# Patient Record
Sex: Male | Born: 1965 | Race: White | Hispanic: No | Marital: Married
Health system: Southern US, Community
[De-identification: ages and names within clinical notes are randomized; demographics above are authoritative.]

---

## 2009-01-20 ENCOUNTER — Emergency Department (HOSPITAL_COMMUNITY): Admission: EM | Admit: 2009-01-20 | Discharge: 2009-01-20 | Payer: Self-pay | Admitting: Emergency Medicine

## 2010-07-19 LAB — CBC
HCT: 43.8 % (ref 39.0–52.0)
Hemoglobin: 14.7 g/dL (ref 13.0–17.0)
MCV: 86.6 fL (ref 78.0–100.0)
RBC: 5.06 MIL/uL (ref 4.22–5.81)
RDW: 12.6 % (ref 11.5–15.5)

## 2010-07-19 LAB — DIFFERENTIAL
Basophils Relative: 1 % (ref 0–1)
Monocytes Relative: 9 % (ref 3–12)

## 2010-07-19 LAB — POCT CARDIAC MARKERS
CKMB, poc: <1 ng/mL — ABNORMAL LOW (ref 1.0–8.0)
Myoglobin, poc: 50.8 ng/mL (ref 12–200)

## 2010-07-19 LAB — COMPREHENSIVE METABOLIC PANEL
AST: 17 U/L (ref 0–37)
Calcium: 10 mg/dL (ref 8.4–10.5)
GFR calc Af Amer: >60 mL/min (ref >60)
GFR calc non Af Amer: >60 mL/min (ref >60)
Sodium: 140 mEq/L (ref 135–145)

## 2014-03-30 ENCOUNTER — Ambulatory Visit
Admission: RE | Admit: 2014-03-30 | Discharge: 2014-03-30 | Disposition: A | Discharge status: Home / Self Care | Payer: BC Managed Care – PPO | Source: Ambulatory Visit | Attending: Family Medicine | Admitting: Family Medicine

## 2014-03-30 ENCOUNTER — Other Ambulatory Visit: Payer: Self-pay | Admitting: Family Medicine

## 2014-03-30 DIAGNOSIS — M79605 Pain in left leg: Secondary | ICD-10-CM

## 2014-03-30 DIAGNOSIS — R200 Anesthesia of skin: Secondary | ICD-10-CM

## 2015-08-25 IMAGING — CR DG LUMBAR SPINE COMPLETE 4+V
5 series · 5 of 5 positions shown · non-contrast
Comparison: None.

CLINICAL DATA: Lower back pain for 1 months. Sacral area,
especially when sitting. No known injury

EXAM:
LUMBAR SPINE - COMPLETE 4+ VIEW

[view not recorded (1 of 5)]
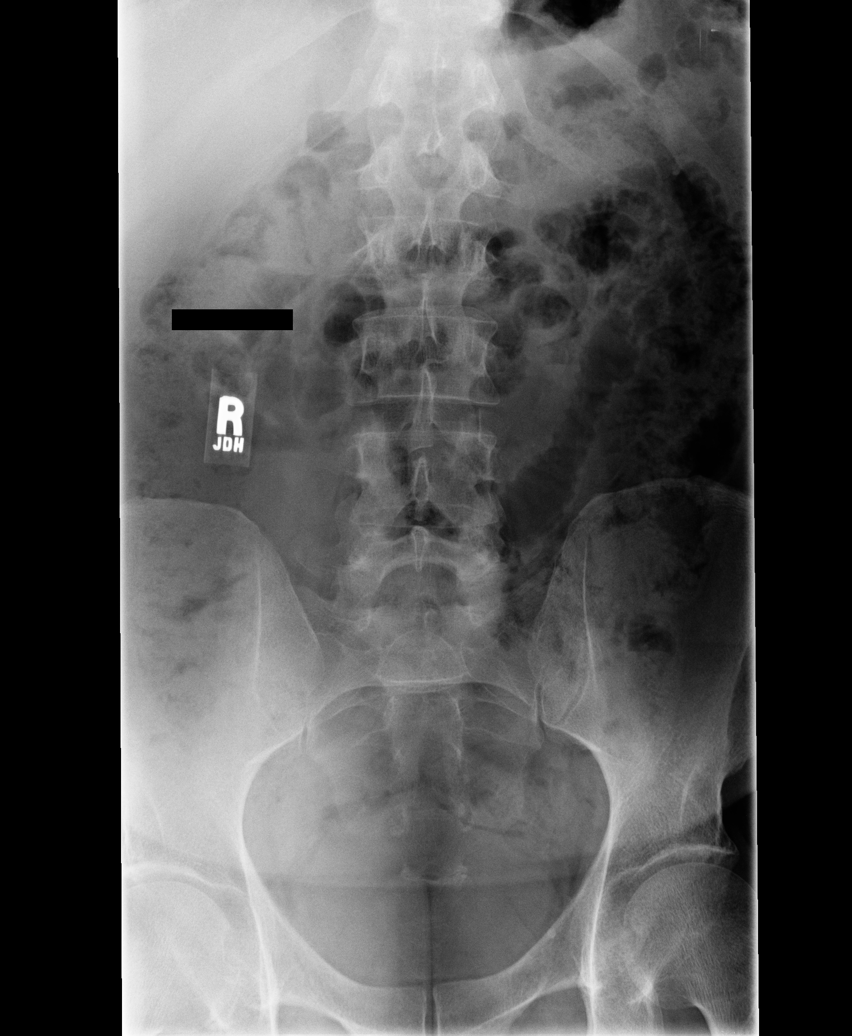

[view not recorded (2 of 5)]
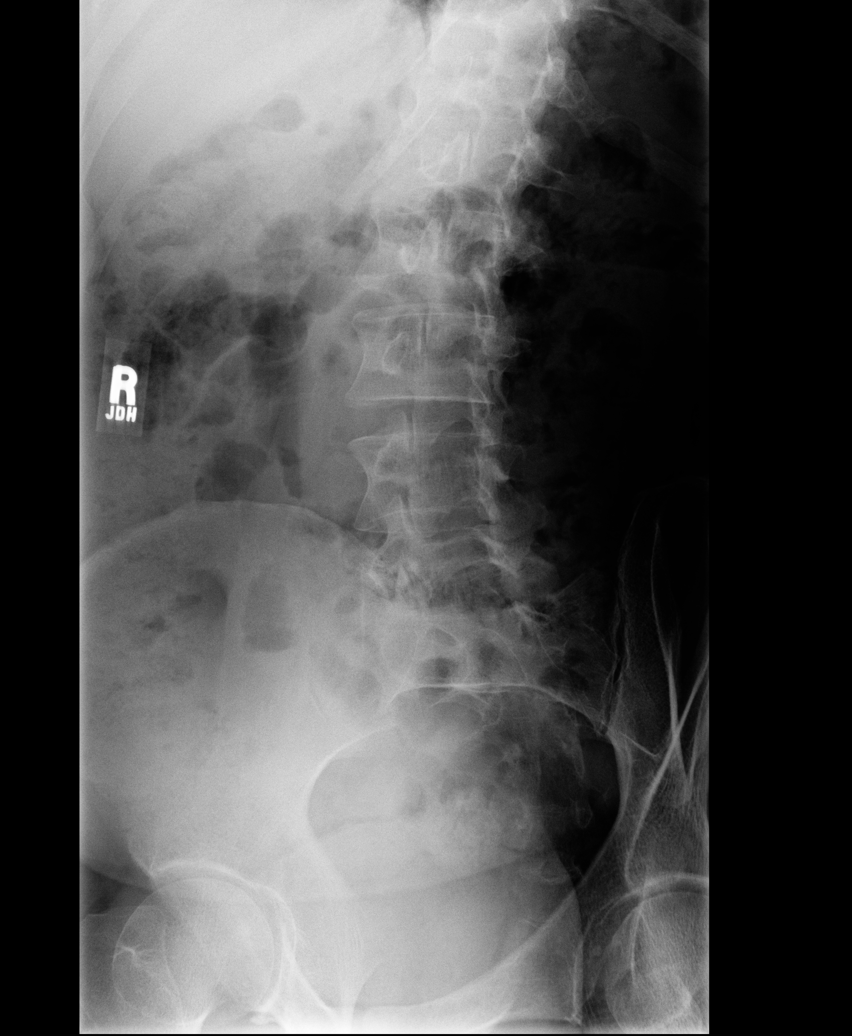

[view not recorded (3 of 5)]
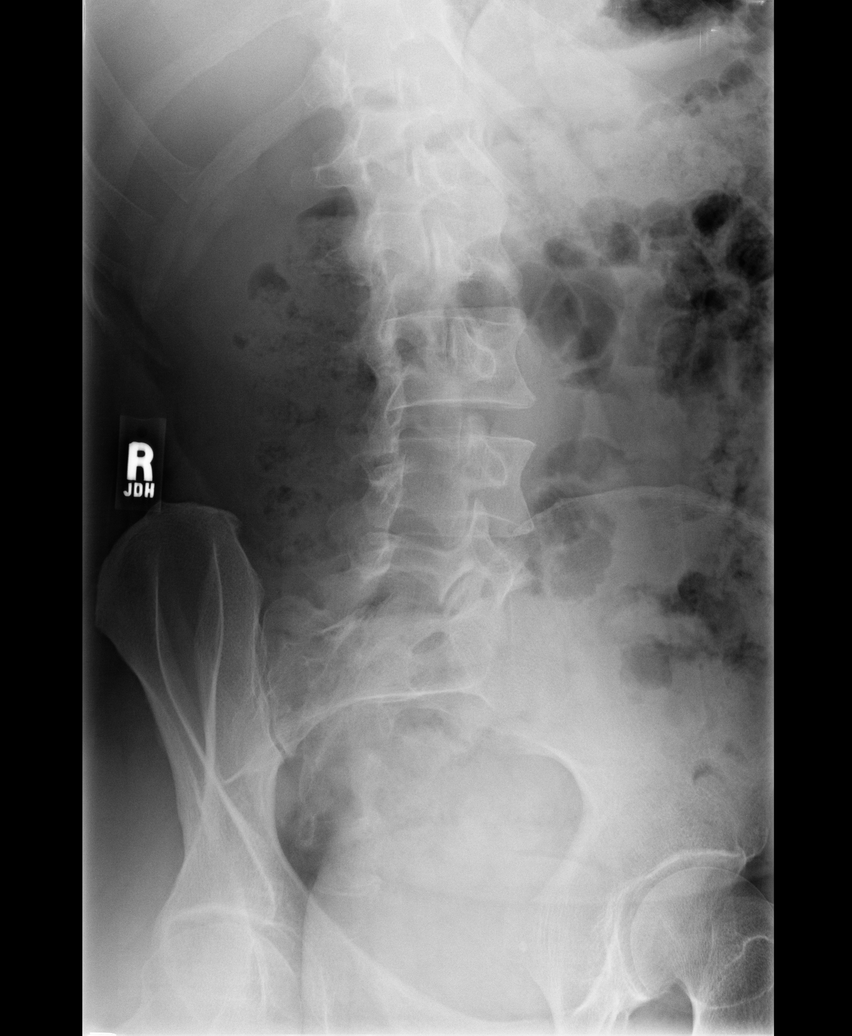

[view not recorded (4 of 5)]
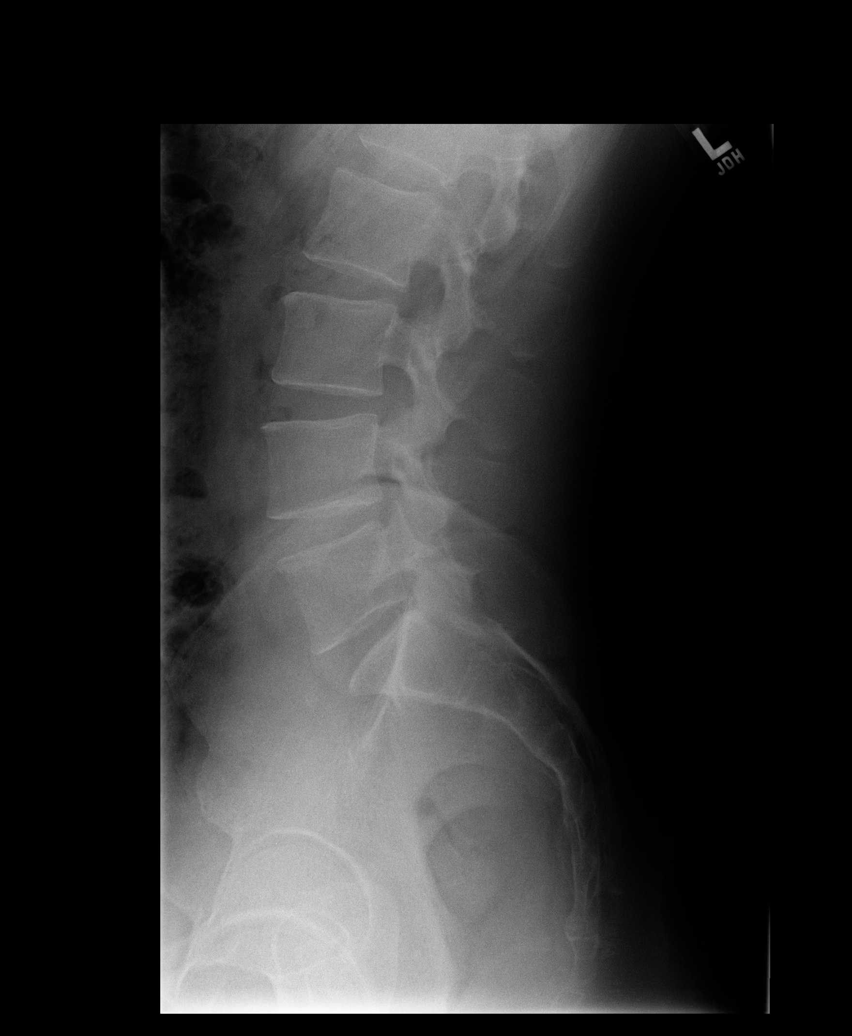

[view not recorded (5 of 5)]
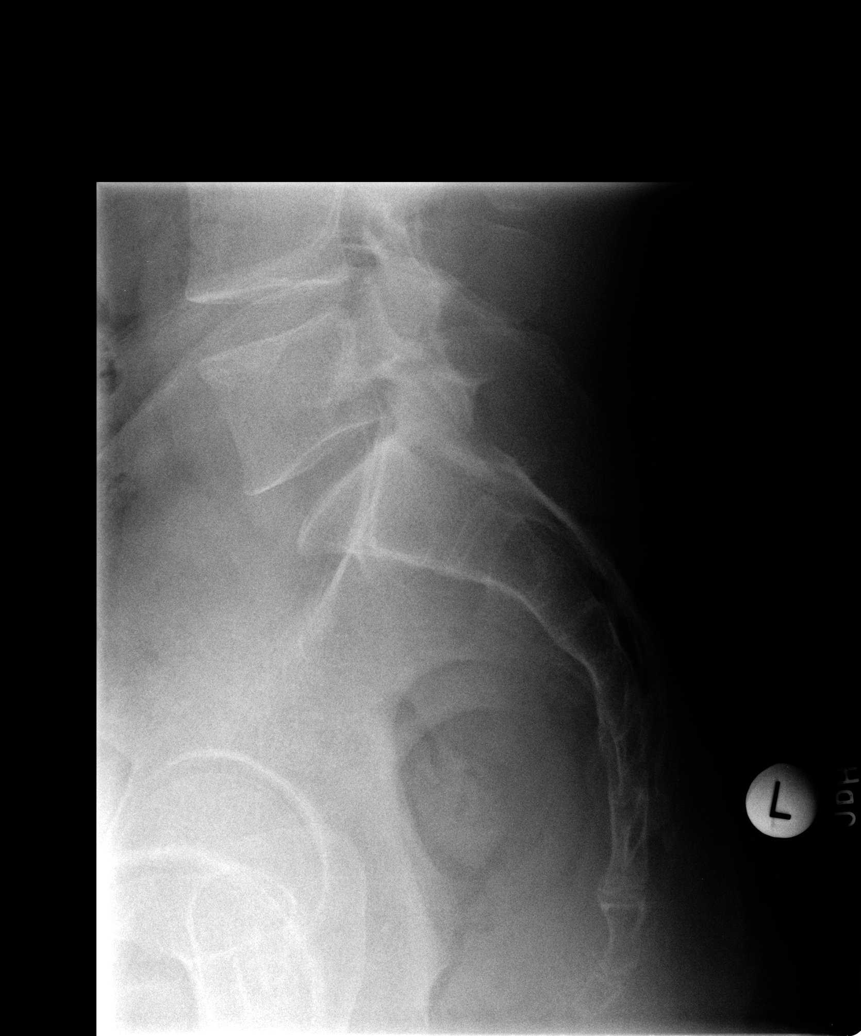

[5 of 5 positions shown; findings below may reference images not displayed]

FINDINGS: There is no evidence of lumbar spine fracture. Alignment is normal.
Intervertebral disc spaces are maintained.
IMPRESSION: Negative.

## 2015-10-06 DIAGNOSIS — S61032A Puncture wound without foreign body of left thumb without damage to nail, initial encounter: Secondary | ICD-10-CM | POA: Diagnosis not present

## 2016-08-19 DIAGNOSIS — Z0001 Encounter for general adult medical examination with abnormal findings: Secondary | ICD-10-CM | POA: Diagnosis not present

## 2016-08-21 DIAGNOSIS — E785 Hyperlipidemia, unspecified: Secondary | ICD-10-CM | POA: Diagnosis not present

## 2016-08-21 DIAGNOSIS — Z0001 Encounter for general adult medical examination with abnormal findings: Secondary | ICD-10-CM | POA: Diagnosis not present

## 2016-08-21 DIAGNOSIS — Z125 Encounter for screening for malignant neoplasm of prostate: Secondary | ICD-10-CM | POA: Diagnosis not present

## 2016-08-21 DIAGNOSIS — R102 Pelvic and perineal pain: Secondary | ICD-10-CM | POA: Diagnosis not present

## 2017-05-22 DIAGNOSIS — L03116 Cellulitis of left lower limb: Secondary | ICD-10-CM | POA: Diagnosis not present

## 2017-07-07 DIAGNOSIS — R05 Cough: Secondary | ICD-10-CM | POA: Diagnosis not present

## 2017-08-21 DIAGNOSIS — Z0000 Encounter for general adult medical examination without abnormal findings: Secondary | ICD-10-CM | POA: Diagnosis not present

## 2017-08-28 DIAGNOSIS — Z125 Encounter for screening for malignant neoplasm of prostate: Secondary | ICD-10-CM | POA: Diagnosis not present

## 2017-08-28 DIAGNOSIS — E785 Hyperlipidemia, unspecified: Secondary | ICD-10-CM | POA: Diagnosis not present

## 2018-01-28 DIAGNOSIS — R109 Unspecified abdominal pain: Secondary | ICD-10-CM | POA: Diagnosis not present

## 2018-03-31 DIAGNOSIS — R1031 Right lower quadrant pain: Secondary | ICD-10-CM | POA: Diagnosis not present

## 2018-04-07 ENCOUNTER — Other Ambulatory Visit: Payer: Self-pay | Admitting: Family Medicine

## 2018-08-06 ENCOUNTER — Ambulatory Visit
Admission: RE | Admit: 2018-08-06 | Discharge: 2018-08-06 | Disposition: A | Discharge status: Home / Self Care | Payer: BLUE CROSS/BLUE SHIELD | Source: Ambulatory Visit | Attending: Family Medicine | Admitting: Family Medicine

## 2018-08-06 ENCOUNTER — Other Ambulatory Visit: Payer: Self-pay | Admitting: Family Medicine

## 2018-08-06 ENCOUNTER — Other Ambulatory Visit: Payer: Self-pay

## 2018-08-06 DIAGNOSIS — M25551 Pain in right hip: Secondary | ICD-10-CM

## 2018-08-06 DIAGNOSIS — R1031 Right lower quadrant pain: Secondary | ICD-10-CM | POA: Diagnosis not present

## 2018-08-10 ENCOUNTER — Other Ambulatory Visit: Payer: Self-pay | Admitting: Family Medicine

## 2018-08-10 DIAGNOSIS — R1031 Right lower quadrant pain: Secondary | ICD-10-CM

## 2018-08-11 ENCOUNTER — Ambulatory Visit
Admission: RE | Admit: 2018-08-11 | Discharge: 2018-08-11 | Disposition: A | Discharge status: Home / Self Care | Payer: BLUE CROSS/BLUE SHIELD | Source: Ambulatory Visit | Attending: Family Medicine | Admitting: Family Medicine

## 2018-08-11 ENCOUNTER — Other Ambulatory Visit: Payer: Self-pay

## 2018-08-11 DIAGNOSIS — R1031 Right lower quadrant pain: Secondary | ICD-10-CM | POA: Diagnosis not present

## 2019-08-28 ENCOUNTER — Ambulatory Visit: Payer: Self-pay

## 2019-08-28 NOTE — Progress Notes (Signed)
   [facility]ov[facility] Vaccination [facility]  Name:  [name]    MRN: [id number] DOB: 10/29/1965  08/28/2019  [name] was observed post Covid-19 immunization for 15 minutes without incident. He was provided with Vaccine Information Sheet and instruction to access the V-Safe system.   [name] was instructed to call 911 with any severe reactions post vaccine: Marland Kitchen Difficulty breathing  . Swelling of face and throat  . A fast heartbeat  . A bad rash all over body  . Dizziness and weakness   Immunizations Administered    Name Date Dose VIS Date Route   Pfizer COVID-19 Vaccine 08/28/2019 11:32 AM 0.3 mL 06/09/2018 Intramuscular   Manufacturer: [employer], [employer]   Lot: [id number]   NDC: [address]-7

## 2019-09-20 ENCOUNTER — Ambulatory Visit: Payer: Self-pay | Attending: Internal Medicine

## 2019-09-20 DIAGNOSIS — Z23 Encounter for immunization: Secondary | ICD-10-CM

## 2019-09-20 NOTE — Progress Notes (Signed)
° °  Covid-19 Vaccination Clinic  Name:  Cobain Morici    MRN: [id number] DOB: [birth year]  09/20/2019  Mr. Loy was observed post Covid-19 immunization for 15 minutes without incident. He was provided with Vaccine Information Sheet and instruction to access the V-Safe system.   Mr. Husmann was instructed to call 911 with any severe reactions post vaccine:  Difficulty breathing   Swelling of face and throat   A fast heartbeat   A bad rash all over body   Dizziness and weakness   Immunizations Administered    Name Date Dose VIS Date Route   Pfizer COVID-19 Vaccine 09/20/2019 10:14 AM 0.3 mL 06/09/2018 Intramuscular   Manufacturer: [employer], [employer]   Lot: [id number]   NDC: [address]-1

## 2019-11-23 DIAGNOSIS — E785 Hyperlipidemia, unspecified: Secondary | ICD-10-CM | POA: Diagnosis not present

## 2019-11-23 DIAGNOSIS — Z0000 Encounter for general adult medical examination without abnormal findings: Secondary | ICD-10-CM | POA: Diagnosis not present

## 2019-11-23 DIAGNOSIS — Z125 Encounter for screening for malignant neoplasm of prostate: Secondary | ICD-10-CM | POA: Diagnosis not present

## 2020-01-06 IMAGING — US US PELVIS LIMITED
1 series · 10 of 10 positions shown · non-contrast
Comparison: None.

CLINICAL DATA: Right inguinal pain.

EXAM:
LIMITED ULTRASOUND OF PELVIS
TECHNIQUE: Limited transabdominal ultrasound examination of the pelvis was
performed.

[Series 1: us pelvis limited · 0.06mm/px · 10 acquisitions, 10 frames shown]
[im 1/10]
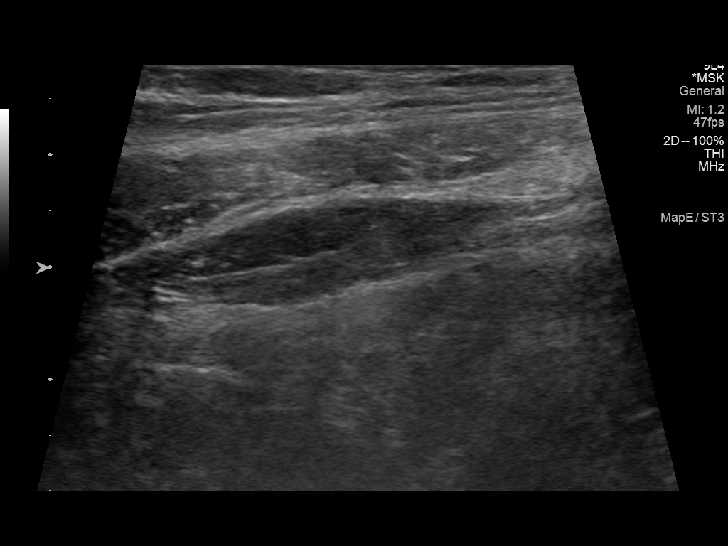
[im 2/10]
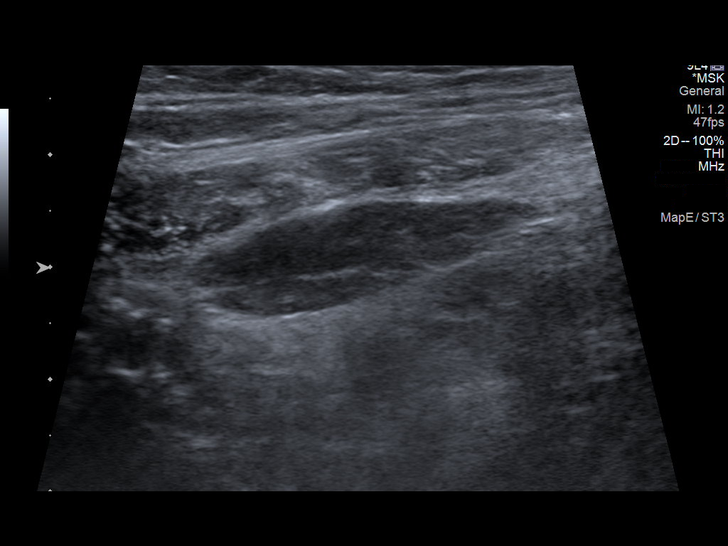
[im 3/10]
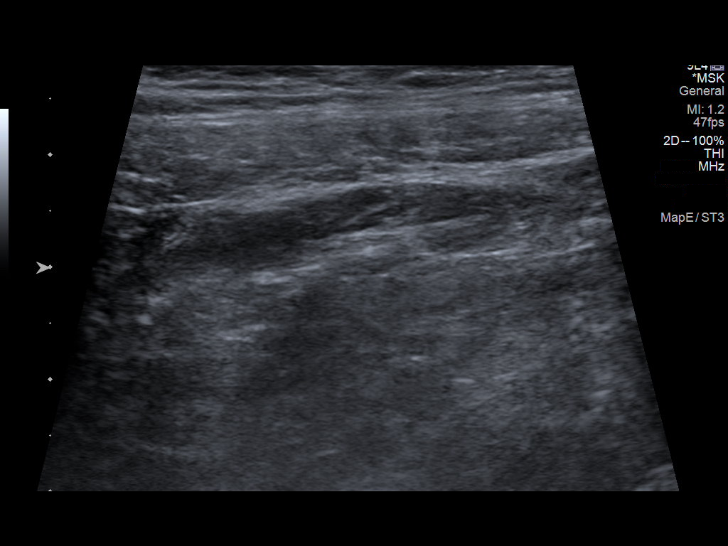
[im 4/10]
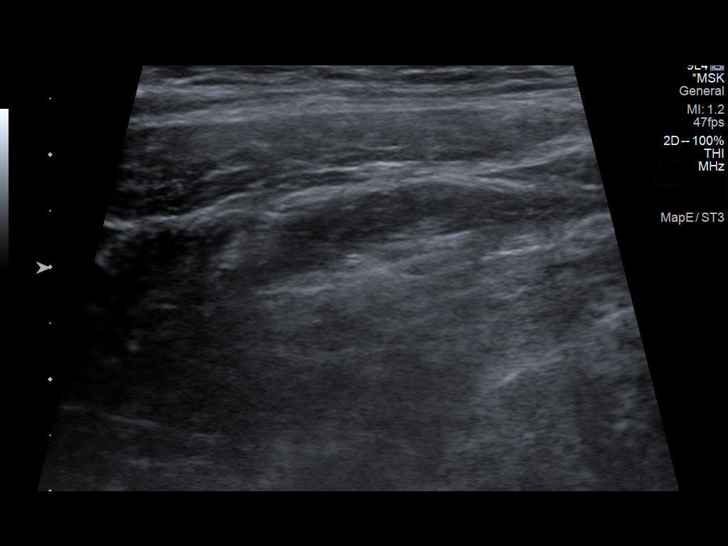
[im 5/10]
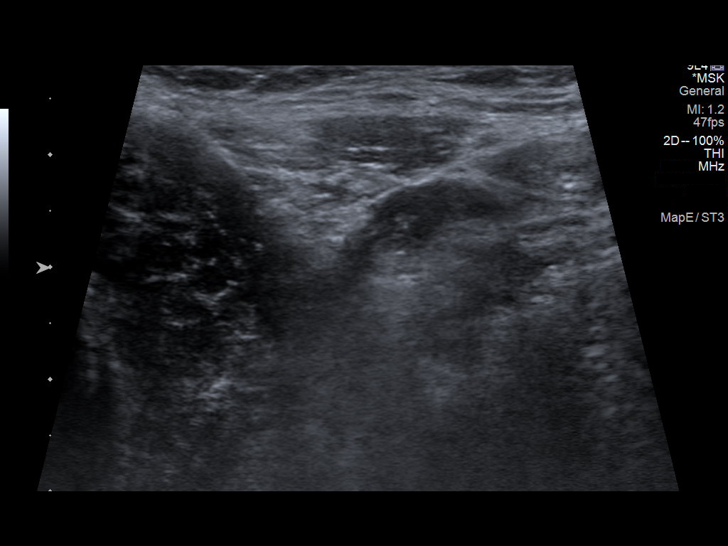
[im 6/10]
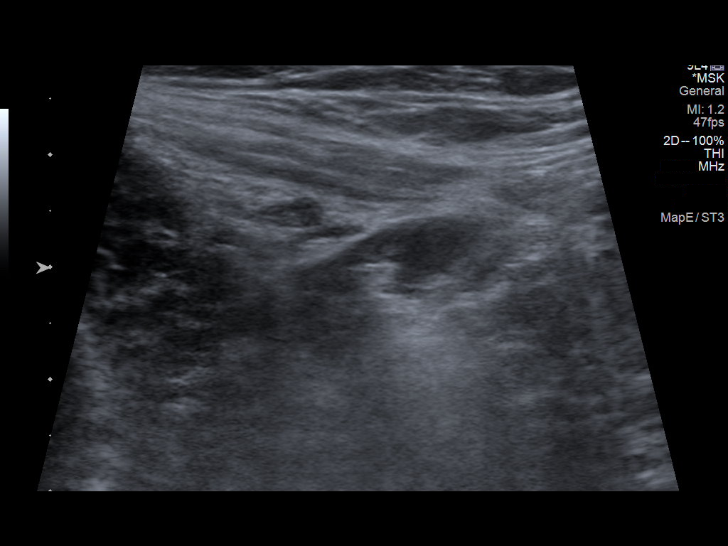
[im 7/10]
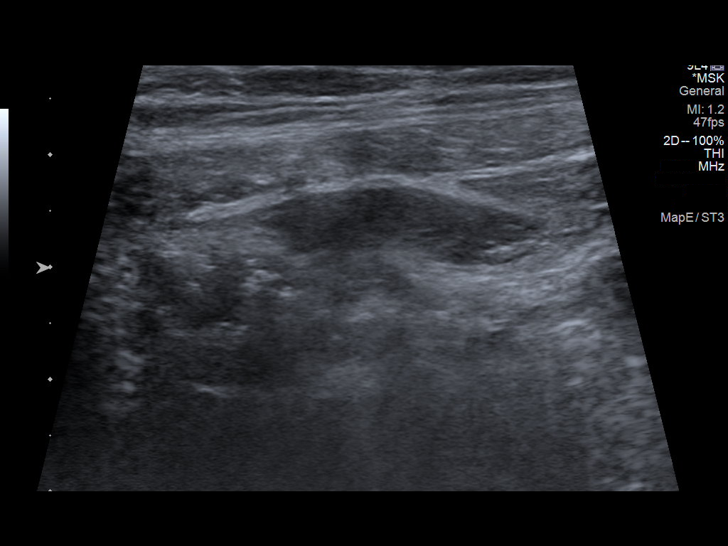
[im 8/10]
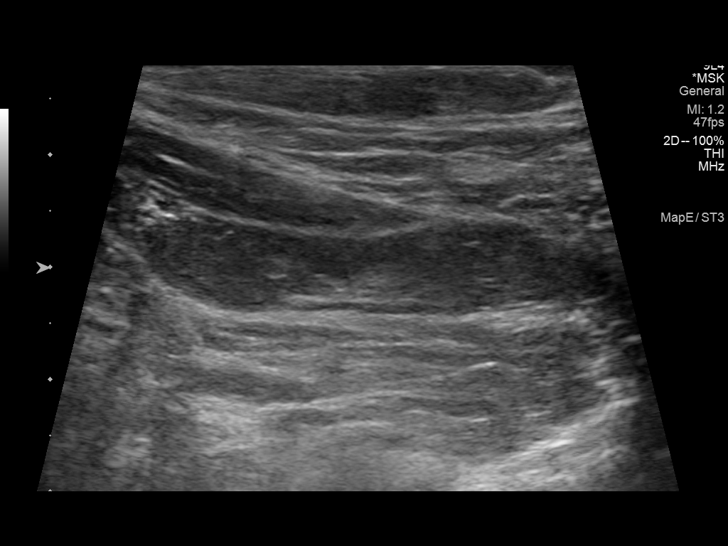
[im 9/10]
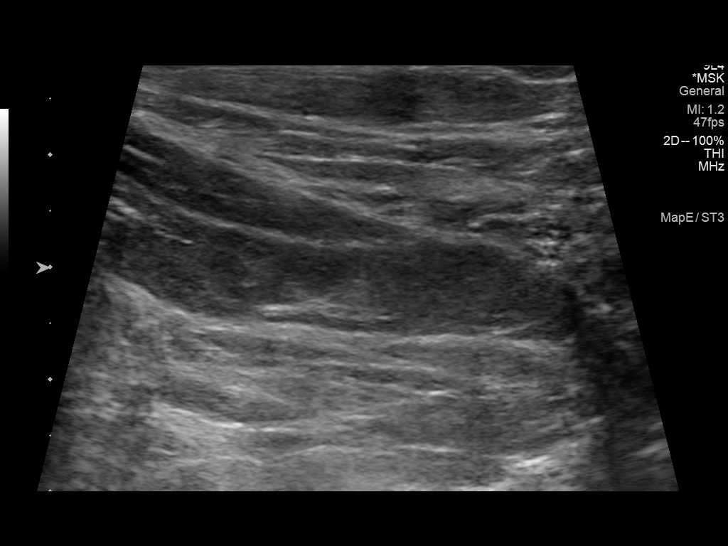
[im 10/10]
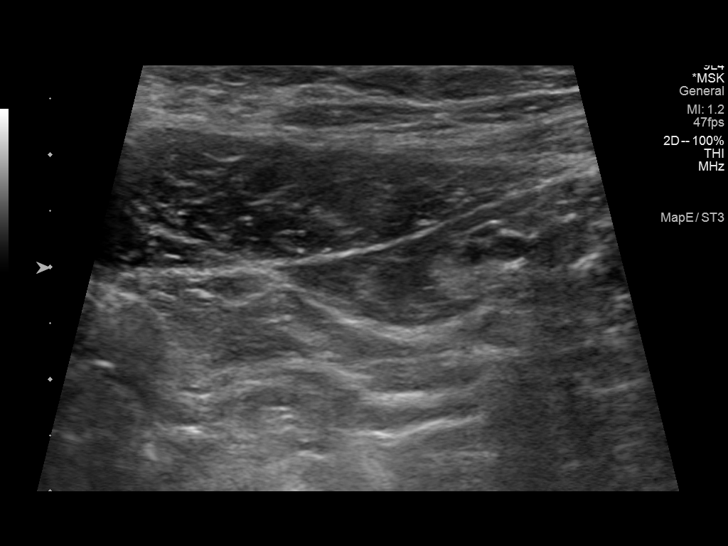

[10 of 10 positions shown; findings below may reference images not displayed]

FINDINGS: Limited sonographic evaluation was performed of both inguinal
regions. No definite evidence of mass, fluid collection or hernia is
noted. No definite adenopathy is noted.
IMPRESSION: No definite sonographic abnormality seen in the right inguinal
region.

## 2020-04-24 DIAGNOSIS — M25561 Pain in right knee: Secondary | ICD-10-CM | POA: Diagnosis not present

## 2020-04-24 DIAGNOSIS — F458 Other somatoform disorders: Secondary | ICD-10-CM | POA: Diagnosis not present

## 2020-10-13 DIAGNOSIS — E785 Hyperlipidemia, unspecified: Secondary | ICD-10-CM | POA: Diagnosis not present

## 2020-10-13 DIAGNOSIS — Z23 Encounter for immunization: Secondary | ICD-10-CM | POA: Diagnosis not present

## 2020-10-13 DIAGNOSIS — Z125 Encounter for screening for malignant neoplasm of prostate: Secondary | ICD-10-CM | POA: Diagnosis not present

## 2020-10-13 DIAGNOSIS — Z0000 Encounter for general adult medical examination without abnormal findings: Secondary | ICD-10-CM | POA: Diagnosis not present

## 2021-01-10 DIAGNOSIS — Z23 Encounter for immunization: Secondary | ICD-10-CM | POA: Diagnosis not present

## 2021-10-26 DIAGNOSIS — E785 Hyperlipidemia, unspecified: Secondary | ICD-10-CM | POA: Diagnosis not present

## 2021-10-26 DIAGNOSIS — Z0000 Encounter for general adult medical examination without abnormal findings: Secondary | ICD-10-CM | POA: Diagnosis not present

## 2021-10-26 DIAGNOSIS — Z125 Encounter for screening for malignant neoplasm of prostate: Secondary | ICD-10-CM | POA: Diagnosis not present

## 2021-10-26 DIAGNOSIS — K635 Polyp of colon: Secondary | ICD-10-CM | POA: Diagnosis not present

## 2021-10-26 DIAGNOSIS — Z1159 Encounter for screening for other viral diseases: Secondary | ICD-10-CM | POA: Diagnosis not present

## 2021-10-26 DIAGNOSIS — R209 Unspecified disturbances of skin sensation: Secondary | ICD-10-CM | POA: Diagnosis not present

## 2021-11-01 ENCOUNTER — Other Ambulatory Visit (HOSPITAL_BASED_OUTPATIENT_CLINIC_OR_DEPARTMENT_OTHER): Payer: Self-pay | Admitting: Family Medicine

## 2021-11-01 ENCOUNTER — Other Ambulatory Visit: Payer: Self-pay | Admitting: Family Medicine

## 2021-11-01 DIAGNOSIS — E785 Hyperlipidemia, unspecified: Secondary | ICD-10-CM

## 2021-11-27 ENCOUNTER — Encounter (HOSPITAL_BASED_OUTPATIENT_CLINIC_OR_DEPARTMENT_OTHER): Payer: Self-pay

## 2021-11-27 ENCOUNTER — Ambulatory Visit (HOSPITAL_BASED_OUTPATIENT_CLINIC_OR_DEPARTMENT_OTHER)
Admission: RE | Admit: 2021-11-27 | Discharge: 2021-11-27 | Disposition: A | Discharge status: Home / Self Care | Payer: Self-pay | Source: Ambulatory Visit | Attending: Family Medicine | Admitting: Family Medicine

## 2021-11-27 DIAGNOSIS — E785 Hyperlipidemia, unspecified: Secondary | ICD-10-CM | POA: Insufficient documentation

## 2023-03-26 DIAGNOSIS — G4761 Periodic limb movement disorder: Secondary | ICD-10-CM | POA: Diagnosis not present

## 2023-03-26 DIAGNOSIS — I2510 Atherosclerotic heart disease of native coronary artery without angina pectoris: Secondary | ICD-10-CM | POA: Diagnosis not present

## 2023-03-26 DIAGNOSIS — Z860100 Personal history of colon polyps, unspecified: Secondary | ICD-10-CM | POA: Diagnosis not present

## 2023-03-26 DIAGNOSIS — E785 Hyperlipidemia, unspecified: Secondary | ICD-10-CM | POA: Diagnosis not present

## 2023-03-26 DIAGNOSIS — Z0000 Encounter for general adult medical examination without abnormal findings: Secondary | ICD-10-CM | POA: Diagnosis not present

## 2023-05-27 DIAGNOSIS — Z125 Encounter for screening for malignant neoplasm of prostate: Secondary | ICD-10-CM | POA: Diagnosis not present

## 2023-05-27 DIAGNOSIS — G4761 Periodic limb movement disorder: Secondary | ICD-10-CM | POA: Diagnosis not present

## 2023-05-27 DIAGNOSIS — E785 Hyperlipidemia, unspecified: Secondary | ICD-10-CM | POA: Diagnosis not present

## 2023-06-13 DIAGNOSIS — K4090 Unilateral inguinal hernia, without obstruction or gangrene, not specified as recurrent: Secondary | ICD-10-CM | POA: Diagnosis not present

## 2023-07-02 DIAGNOSIS — K4090 Unilateral inguinal hernia, without obstruction or gangrene, not specified as recurrent: Secondary | ICD-10-CM | POA: Diagnosis not present

## 2023-09-22 DIAGNOSIS — E785 Hyperlipidemia, unspecified: Secondary | ICD-10-CM | POA: Diagnosis not present

## 2024-03-31 DIAGNOSIS — Z0000 Encounter for general adult medical examination without abnormal findings: Secondary | ICD-10-CM | POA: Diagnosis not present

## 2024-03-31 DIAGNOSIS — E785 Hyperlipidemia, unspecified: Secondary | ICD-10-CM | POA: Diagnosis not present

## 2024-03-31 DIAGNOSIS — Z1331 Encounter for screening for depression: Secondary | ICD-10-CM | POA: Diagnosis not present

## 2024-03-31 DIAGNOSIS — I2510 Atherosclerotic heart disease of native coronary artery without angina pectoris: Secondary | ICD-10-CM | POA: Diagnosis not present

## 2024-03-31 DIAGNOSIS — H8111 Benign paroxysmal vertigo, right ear: Secondary | ICD-10-CM | POA: Diagnosis not present
